# Patient Record
Sex: Male | Born: 2000 | Race: White | Hispanic: No | Marital: Single | Smoking: Never smoker
Health system: Southern US, Community
[De-identification: ages and names within clinical notes are randomized; demographics above are authoritative.]

## PROBLEM LIST (undated history)

## (undated) DIAGNOSIS — Z889 Allergy status to unspecified drugs, medicaments and biological substances status: Secondary | ICD-10-CM

## (undated) HISTORY — PX: FRACTURE SURGERY: SHX138

---
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
## 2020-05-17 IMAGING — CT CT ABDOMEN AND PELVIS WITH CONTRAST
2 of 4 series · 15 of 46 positions shown, 17 images · IV contrast (APPLIED)
Comparison: None.

CLINICAL DATA: Diffuse lower abdominal pain

EXAM:
CT ABDOMEN AND PELVIS WITH CONTRAST
TECHNIQUE: Multidetector CT imaging of the abdomen and pelvis was performed
using the standard protocol following bolus administration of
intravenous contrast.
CONTRAST:  100mL OMNIPAQUE IOHEXOL 300 MG/ML  SOLN

[Series 2: axial st · axial · 0.81mm/px · z∈[-445,-5]mm · 12 of 97 slices shown, 14 images]
[im 5/97  soft-tissue]
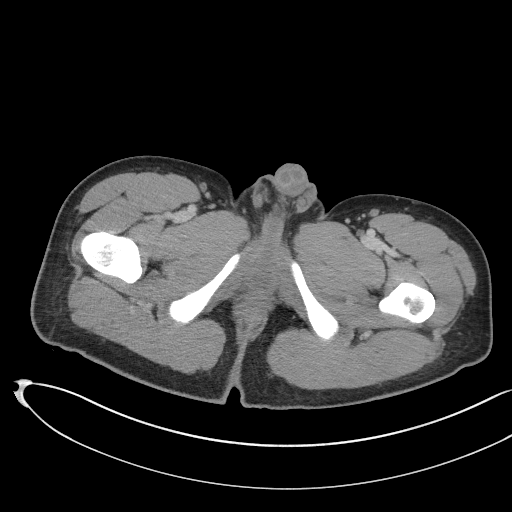
[im 5/97  bone]
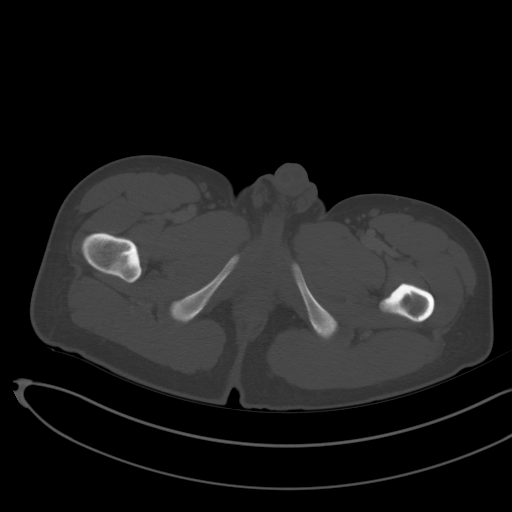
[im 13/97  soft-tissue]
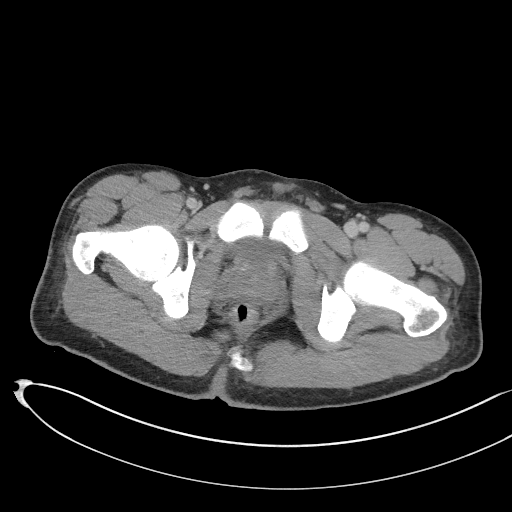
[im 21/97  soft-tissue]
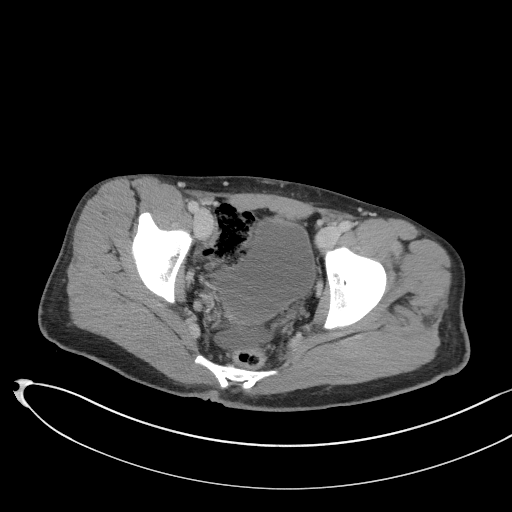
[im 29/97  soft-tissue]
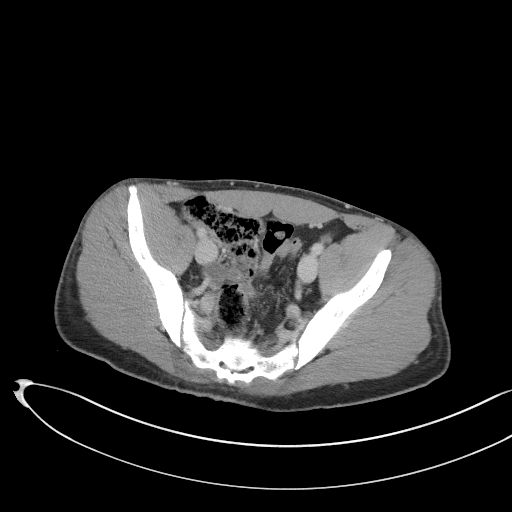
[im 37/97  soft-tissue]
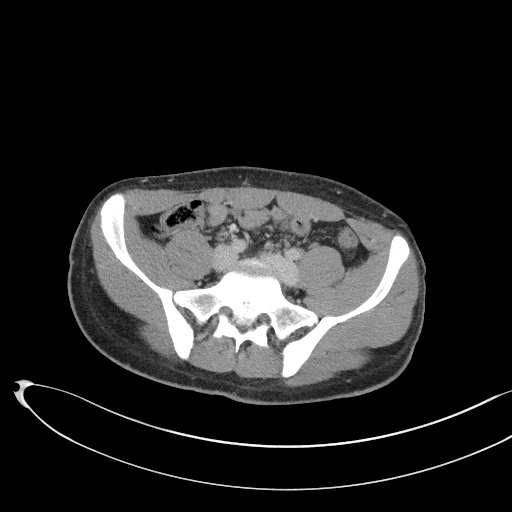
[im 45/97  soft-tissue]
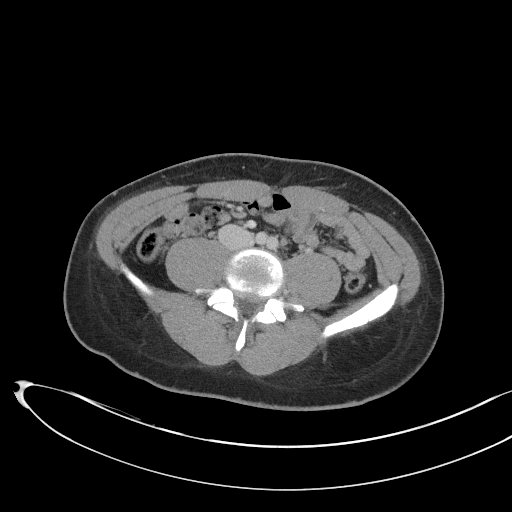
[im 53/97  soft-tissue]
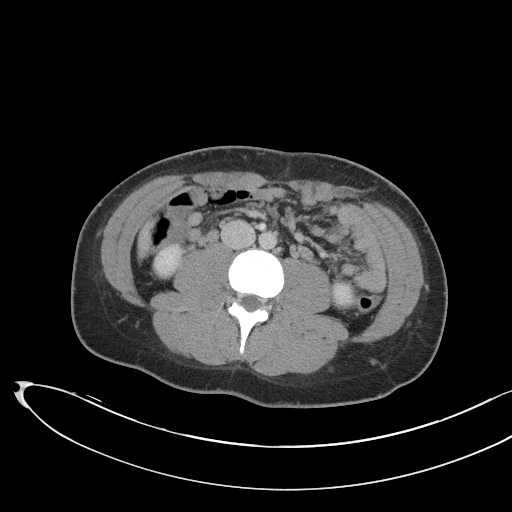
[im 61/97  soft-tissue]
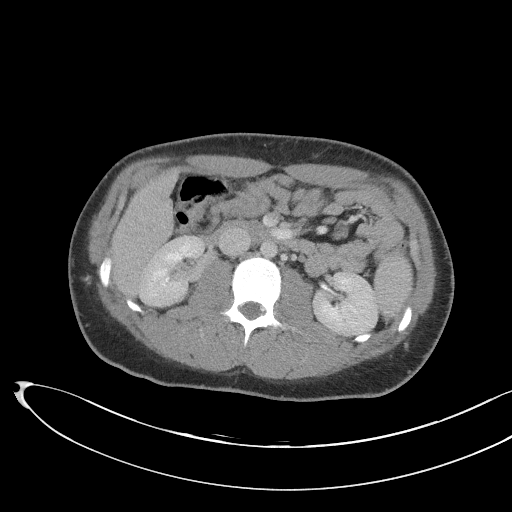
[im 69/97  soft-tissue]
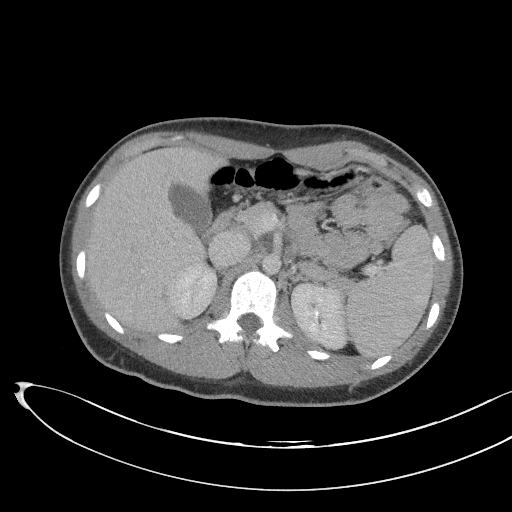
[im 69/97  bone]
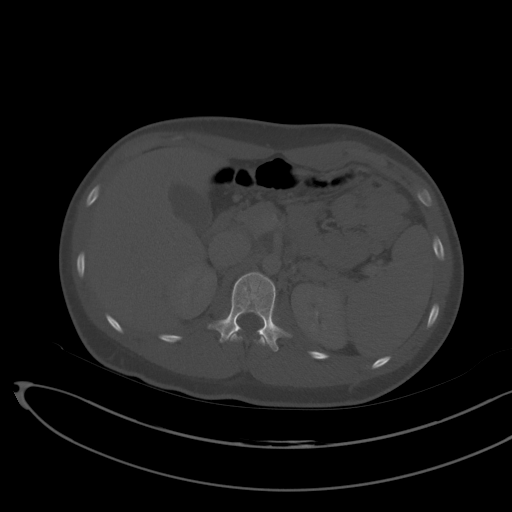
[im 77/97  soft-tissue]
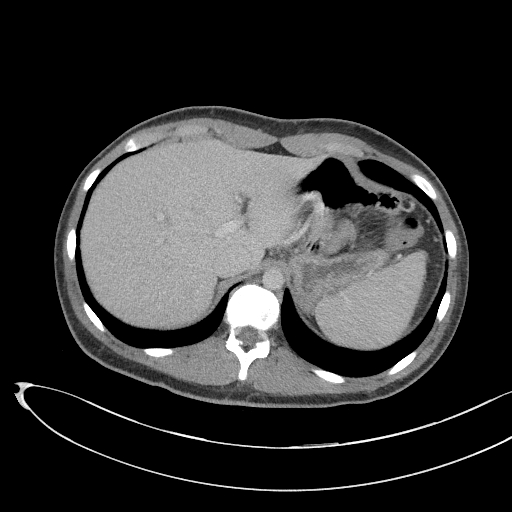
[im 85/97  soft-tissue]
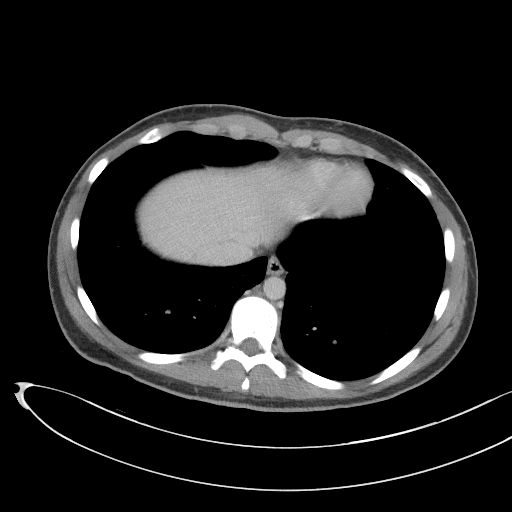
[im 93/97  soft-tissue]
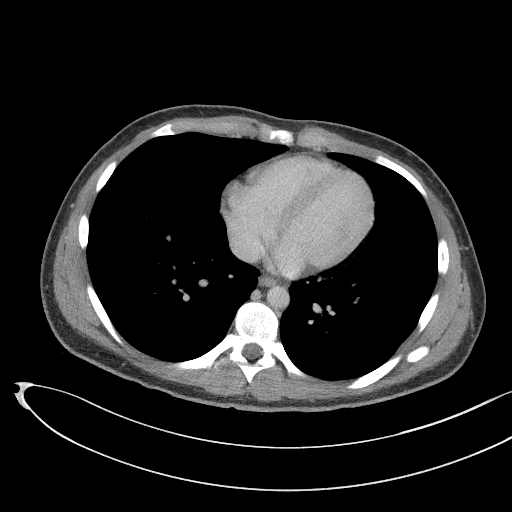

[Series 5: coronal st · coronal · 0.71mm/px · 3 of 87 slices shown]
[im 29/87  soft-tissue]
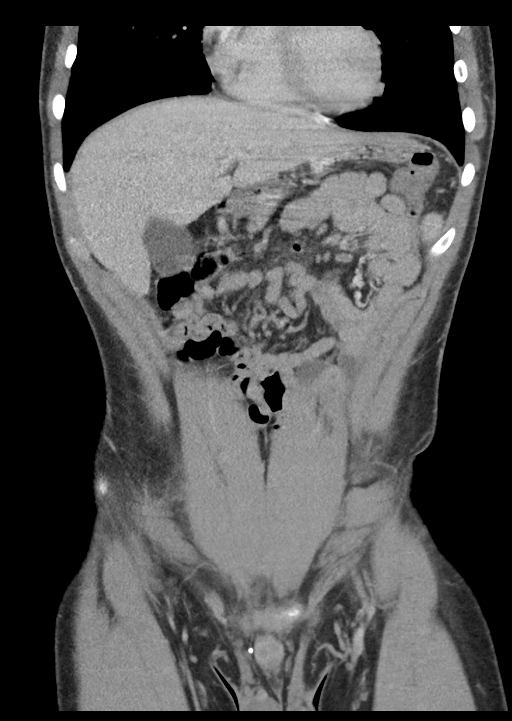
[im 39/87  soft-tissue]
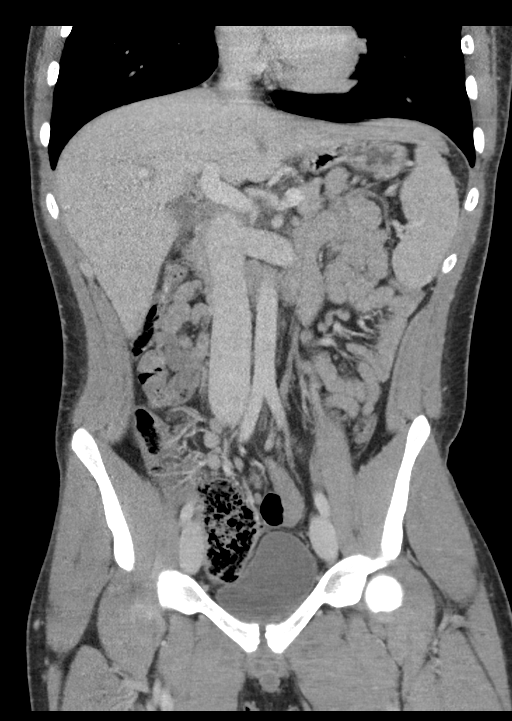
[im 48/87  soft-tissue]
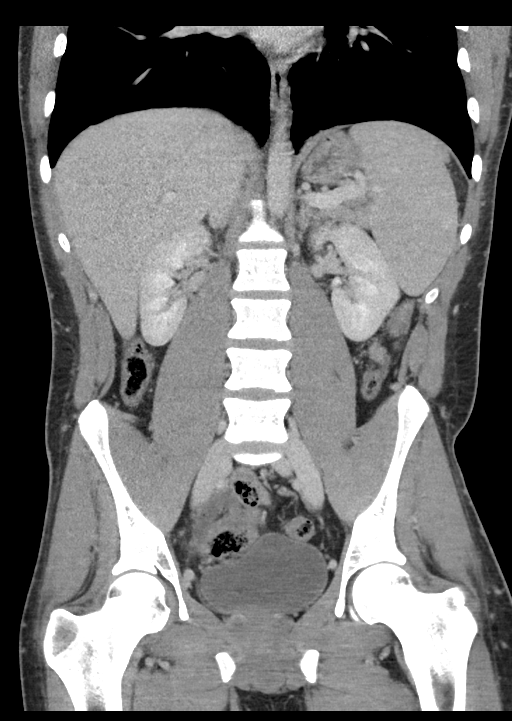

[15 of 46 positions shown; findings below may reference images not displayed]

FINDINGS: Lower chest: No acute abnormality.

Hepatobiliary: No focal liver abnormality is seen. No gallstones,
gallbladder wall thickening, or biliary dilatation.

Pancreas: Unremarkable. No pancreatic ductal dilatation or
surrounding inflammatory changes.

Spleen: Normal in size without focal abnormality.

Adrenals/Urinary Tract: Adrenal glands are within normal limits
bilaterally. Kidneys demonstrate normal enhancement. No definitive
calculi are seen. Early excretion of contrast is noted. The bladder
is partially distended.

Stomach/Bowel: Large bowel and small bowel are well visualized and
within normal limits. The stomach is unremarkable.

Appendix: Location: Lateral and slightly anterior to the cecum.

Diameter: 17 mm

Appendicolith: Multiple appendicoliths are noted.

Mucosal hyper-enhancement: No significant hyperenhancement is noted
at this time.

Extraluminal gas: Absent

Periappendiceal collection: Absent. No significant periappendiceal
inflammatory changes are noted.

Vascular/Lymphatic: No significant vascular findings are present. No
enlarged abdominal or pelvic lymph nodes.

Reproductive: Prostate is unremarkable.

Other: Minimal free fluid is noted.  No herniation is noted.

Musculoskeletal: No acute or significant osseous findings.
IMPRESSION: Multiple appendicoliths within the central portion of the appendix
with peripheral dilatation. No significant inflammatory changes
noted at this time although these changes are suspicious for very
early appendicitis.

No other focal abnormality is noted.

## 2020-12-30 ENCOUNTER — Telehealth: Payer: Self-pay | Admitting: Family

## 2020-12-30 DIAGNOSIS — S40862A Insect bite (nonvenomous) of left upper arm, initial encounter: Secondary | ICD-10-CM

## 2020-12-30 DIAGNOSIS — W57XXXA Bitten or stung by nonvenomous insect and other nonvenomous arthropods, initial encounter: Secondary | ICD-10-CM

## 2020-12-30 MED ORDER — DOXYCYCLINE HYCLATE 100 MG PO TABS: 100.0000 mg | ORAL_TABLET | Freq: Two times a day (BID) | ORAL | 0 refills | Status: AC

## 2020-12-30 NOTE — Progress Notes (Signed)
Virtual Visit Consent   Dakota Graves, you are scheduled for a virtual visit with a [facility] provider today.     Just as with appointments in the office, your consent must be obtained to participate.  Your consent will be active for this visit and any virtual visit you may have with one of our providers in the next 365 days.     If you have a MyChart account, a copy of this consent can be sent to you electronically.  All virtual visits are billed to your insurance company just like a traditional visit in the office.    As this is a virtual visit, video technology does not allow for your provider to perform a traditional examination.  This may limit your provider's ability to fully assess your condition.  If your provider identifies any concerns that need to be evaluated in person or the need to arrange testing (such as labs, EKG, etc.), we will make arrangements to do so.     Although advances in technology are sophisticated, we cannot ensure that it will always work on either your end or our end.  If the connection with a video visit is poor, the visit may have to be switched to a telephone visit.  With either a video or telephone visit, we are not always able to ensure that we have a secure connection.     I need to obtain your verbal consent now.   Are you willing to proceed with your visit today?    Dakota Graves has provided verbal consent on 12/30/2020 for a virtual visit (video or telephone).   Jannifer Rodney, FNP   Date: 12/30/2020 2:06 PM   Virtual Visit via Video Note   I, Jannifer Rodney, connected with  Dakota Graves  ([id number], 02/12/2001) on 12/30/20 at  1:45 PM EDT by a video-enabled telemedicine application and verified that I am speaking with the correct person using two identifiers.  Location: Patient: Virtual Visit Location Patient: Home Provider: Virtual Visit Location Provider: Home   I discussed the limitations of evaluation and management by telemedicine and the  availability of in person appointments. The patient expressed understanding and agreed to proceed.    History of Present Illness: Dakota Graves is a 20 y.o. who identifies as a male who was assigned male at birth, and is being seen today for insect bite on his left bicep. He states he noticed the bite four days ago and then two days ago noticed increased redness, tenderness, and warmth to the area. He states there are red streaks around the area and is worsening.   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  Resting comfortably  at home.  Head is normocephalic, atraumatic.  No labored breathing.  Speech is clear and coherent with logical content.  Patient is alert and oriented at baseline.  Silver dollar size erythemas with center erythemas  Assessment and Plan: 1. Insect bite of left upper arm, initial encounter - doxycycline (VIBRA-TABS) 100 MG  tablet; Take 1 tablet (100 mg total) by mouth 2 (two) times daily.  Dispense: 20 tablet; Refill: 0 Mark area with sharpie, follow up  if redness worsens or does not improve  Report fever, increased redness, or tenderness Tylenol as needed  Follow Up Instructions: I discussed the assessment and treatment plan with the patient. The patient was provided an opportunity to ask questions and all were answered. The patient agreed with the plan and demonstrated an understanding of the instructions.  A copy of instructions were sent to the patient via  MyChart.  The patient was advised to call back or seek an in-person evaluation if the symptoms worsen or if the condition fails to improve as anticipated.  Time:  I spent 11 minutes with the patient via telehealth technology discussing the above problems/concerns.    Jannifer Rodney, FNP
